# Patient Record
Sex: Male | Born: 1975 | Hispanic: Yes | Marital: Married | Smoking: Never smoker
Health system: Southern US, Community
[De-identification: ages and names within clinical notes are randomized; demographics above are authoritative.]

## PROBLEM LIST (undated history)

## (undated) HISTORY — PX: OTHER SURGICAL HISTORY: SHX169

---

## 2016-03-31 ENCOUNTER — Emergency Department (HOSPITAL_COMMUNITY)
Admission: EM | Admit: 2016-03-31 | Discharge: 2016-03-31 | Disposition: A | Discharge status: Home / Self Care | Payer: No Typology Code available for payment source | Attending: Emergency Medicine | Admitting: Emergency Medicine

## 2016-03-31 ENCOUNTER — Emergency Department (HOSPITAL_COMMUNITY): Payer: No Typology Code available for payment source

## 2016-03-31 ENCOUNTER — Encounter (HOSPITAL_COMMUNITY): Payer: Self-pay

## 2016-03-31 DIAGNOSIS — Y92410 Unspecified street and highway as the place of occurrence of the external cause: Secondary | ICD-10-CM | POA: Diagnosis not present

## 2016-03-31 DIAGNOSIS — Y939 Activity, unspecified: Secondary | ICD-10-CM | POA: Insufficient documentation

## 2016-03-31 DIAGNOSIS — Y999 Unspecified external cause status: Secondary | ICD-10-CM | POA: Insufficient documentation

## 2016-03-31 DIAGNOSIS — M542 Cervicalgia: Secondary | ICD-10-CM | POA: Insufficient documentation

## 2016-03-31 DIAGNOSIS — M25511 Pain in right shoulder: Secondary | ICD-10-CM | POA: Insufficient documentation

## 2016-03-31 MED ORDER — METHOCARBAMOL 500 MG PO TABS: 500.0000 mg | ORAL_TABLET | Freq: Two times a day (BID) | ORAL | Status: AC

## 2016-03-31 MED ORDER — NAPROXEN 500 MG PO TABS: 500.0000 mg | ORAL_TABLET | Freq: Two times a day (BID) | ORAL | Status: AC
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
## 2016-03-31 NOTE — ED Notes (Signed)
Declined W/C at D/C and was escorted to lobby by RN. 
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
